# Patient Record
Sex: Female | Born: 2000 | Race: Black or African American | Hispanic: No | Marital: Single | Smoking: Never smoker
Health system: Southern US, Community
[De-identification: ages and names within clinical notes are randomized; demographics above are authoritative.]

## PROBLEM LIST (undated history)

## (undated) DIAGNOSIS — J45909 Unspecified asthma, uncomplicated: Secondary | ICD-10-CM

## (undated) DIAGNOSIS — D649 Anemia, unspecified: Secondary | ICD-10-CM

## (undated) HISTORY — DX: Anemia, unspecified: D64.9

## (undated) HISTORY — DX: Unspecified asthma, uncomplicated: J45.909

---
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
## 2020-01-02 ENCOUNTER — Telehealth: Payer: Self-pay | Admitting: Certified Nurse Midwife

## 2020-01-02 DIAGNOSIS — Z30017 Encounter for initial prescription of implantable subdermal contraceptive: Secondary | ICD-10-CM

## 2020-01-02 NOTE — Telephone Encounter (Signed)
Per review of 11/02/19 OV notes, nexplanon insertion discussed with Leota Sauers, CNM. Schedule days 1-5 of menses.   Order previously placed for precert, see account notes.   Routing to business office to review benefits prior to scheduling.

## 2020-01-02 NOTE — Telephone Encounter (Signed)
Patient left voicemail during lunch to schedule nexplanon insertion.

## 2020-01-03 NOTE — Telephone Encounter (Signed)
Patient called to give bcbs information to schedule nexplanon procedure.

## 2020-01-04 NOTE — Telephone Encounter (Signed)
Call placed to convey benefits for Nexplanon. Spoke with patient and conveyed the benefits. Patient understands/agreeable with the benefits. Patient does not want to wait until her cycle starts. Patient is requesting to get this done asap.

## 2020-01-05 NOTE — Telephone Encounter (Signed)
Spoke back with pt. Pt aware of needing to be on cycle for insertion of Nexplanon. Pt changed appt from 01/09/2020 to 01/23/2020 at 2pm with LMP to start on 01/20/2020. Pt agreeable and verbalized understanding.   Will route to Debbi, CNM for review and will close addendum.

## 2020-01-05 NOTE — Telephone Encounter (Signed)
Due to no contraception she needs to be on her menses for insertion.

## 2020-01-05 NOTE — Telephone Encounter (Signed)
Spoke to pt. Pt states having LMP the week 12/23/2019. Pt states is SA and has been on OCPs. Stopped taking OCPs after last LMP because she was out of town and didn't have them with her.  Pt would like to get nexplanon as soon as she can. Pt scheduled for OV for Nexplanon insertion on 01/09/2020 at 130pm. Pt agreeable. Pt aware of possible UPT at office prior to insertion and pt advised to remain abstinent until appt. Pt verbalized understanding. Pt already spoken to [facility] for precert. Orders placed.   Will route to Debbi, CNM for review and will close encounter.  Cc: Rosa for review. Pt aware about PR:$0.

## 2020-01-05 NOTE — Telephone Encounter (Signed)
Attempted to call pt. Unable to leave voice mail due to not set up. Will call back.

## 2020-01-09 ENCOUNTER — Ambulatory Visit: Payer: Self-pay | Admitting: Certified Nurse Midwife

## 2020-01-19 ENCOUNTER — Other Ambulatory Visit: Payer: Self-pay

## 2020-01-23 ENCOUNTER — Other Ambulatory Visit: Payer: Self-pay

## 2020-01-23 ENCOUNTER — Ambulatory Visit (INDEPENDENT_AMBULATORY_CARE_PROVIDER_SITE_OTHER): Payer: BC Managed Care – PPO | Admitting: Certified Nurse Midwife

## 2020-01-23 VITALS — BP 110/70 | Temp 97.7°F | Wt 201.0 lb

## 2020-01-23 DIAGNOSIS — Z309 Encounter for contraceptive management, unspecified: Secondary | ICD-10-CM | POA: Insufficient documentation

## 2020-01-23 DIAGNOSIS — N912 Amenorrhea, unspecified: Secondary | ICD-10-CM | POA: Diagnosis not present

## 2020-01-23 NOTE — Progress Notes (Signed)
Patient here for nexplanon today but unable to do. Patient has not had her menstrual cycle yet & is not on any birth control. Patient is aware to wait until she starts her menstrual & call us so we can schedule her to have it put in. She is aware we will also do upt that day. Patient request a upt to be done today also since she is late on her cycle.  UPT-neg

## 2020-01-24 LAB — POCT URINE PREGNANCY: Preg Test, Ur: NEGATIVE

## 2020-02-08 ENCOUNTER — Telehealth: Payer: Self-pay | Admitting: Certified Nurse Midwife

## 2020-02-08 DIAGNOSIS — Z3009 Encounter for other general counseling and advice on contraception: Secondary | ICD-10-CM

## 2020-02-08 DIAGNOSIS — Z30017 Encounter for initial prescription of implantable subdermal contraceptive: Secondary | ICD-10-CM

## 2020-02-08 NOTE — Telephone Encounter (Signed)
Spoke with patient, ready to proceed with nexplanon insertion. LMP 02/07/20. Nexplanon insertion scheduled for 02/10/20 at 11am with Leota Sauers, CNM. Advised patient to take Motrin 800 mg with food and water one hour before procedure. Patient verbalzies understanding and is agreeable.   Nexplanon discussed during AEX 11/02/19, patient to return for insertion while on menses.   New order placed for precert.   Routing to provider for final review. Patient is agreeable to disposition. Will close encounter.  Cc: Soundra Pilon, [address] [place]

## 2020-02-08 NOTE — Telephone Encounter (Signed)
Patient started her cycle yesterday and calling to schedule nexplanon procedure.

## 2020-02-09 ENCOUNTER — Other Ambulatory Visit: Payer: Self-pay

## 2020-02-10 ENCOUNTER — Other Ambulatory Visit: Payer: Self-pay

## 2020-02-10 ENCOUNTER — Encounter: Payer: Self-pay | Admitting: Certified Nurse Midwife

## 2020-02-10 ENCOUNTER — Ambulatory Visit (INDEPENDENT_AMBULATORY_CARE_PROVIDER_SITE_OTHER): Payer: BC Managed Care – PPO | Admitting: Certified Nurse Midwife

## 2020-02-10 VITALS — BP 120/76 | HR 70 | Temp 98.1°F | Resp 16 | Wt 204.0 lb

## 2020-02-10 DIAGNOSIS — Z01812 Encounter for preprocedural laboratory examination: Secondary | ICD-10-CM | POA: Diagnosis not present

## 2020-02-10 DIAGNOSIS — Z30017 Encounter for initial prescription of implantable subdermal contraceptive: Secondary | ICD-10-CM | POA: Diagnosis not present

## 2020-02-10 DIAGNOSIS — Z3009 Encounter for other general counseling and advice on contraception: Secondary | ICD-10-CM

## 2020-02-10 LAB — POCT URINE PREGNANCY: Preg Test, Ur: NEGATIVE

## 2020-02-10 NOTE — Progress Notes (Signed)
Review of Systems  Constitutional: Negative.   HENT: Negative.   Eyes: Negative.   Respiratory: Negative.   Cardiovascular: Negative.   Gastrointestinal: Negative.   Genitourinary: Negative.   Musculoskeletal: Negative.   Skin: Negative.   Neurological: Negative.   Endo/Heme/Allergies: Negative.   Psychiatric/Behavioral: The patient is nervous/anxious.        No issues other than the procedure    18 yrs African American Single female G0P0000  presents for Nexplanon insertion. LMP February 07, 2020.    Contraception: condoms   UPT negative Questions addressed regarding insertionl and expectations..  Consent signed. Requests same as above.  HPI neg.  Exam: Healthy WDWN female Orientation x 3,  Affect normal Procedure: Patient placed supine on exam table with herleft arm  flexed at the elbow and externally rotated.The insertion site was identified on the inner side of upper arm at the medial Epicondyle of the Humerus. Target site for insertion was marked at 8 cm to 12 cm. The insertion site was cleansed with betadine solution x 3  and 3 ml of 1% Lidocaine Lot # [id number] exp. April 2022 was injected along the insertion site. The inserter covering was removed and the Nexplanon device was visualized in the inserted needle. Skin was pierced and Nexplanon Lot# [id number] was  inserted along tract created by Lidocaine. Inserted was removed and  Nexplanon was immediately palpated in the correct site under the skin intact. Patient also palpated the same.  The area of insertion was closed with a steri strips.  No active bleeding was noted. A small adhesive bandage placed over the insertion site.  A pressure bandage was placed over the site. Instructions for care of insertion site were given verbally and written. ID card with insertion and removal date given to patient to carry in wallet. Patient tolerated procedure well. Patient escorted to check out in stable condition. Recheck in 2-3 weeks or if  issues.  Assessment:  Nexplanon Insertion Pt tolerated procedure well. Planned contraception:The current method of family planning is condoms and now Nexplanon. Plan :Instructions and warning signs and symptoms given.  Remove bandage in 3 days. Questions addressed    Return Visit 2-3 weeks, prn

## 2020-02-10 NOTE — Patient Instructions (Signed)
Nexplanon Instructions After Insertion  Keep bandage clean and dry for 24 hours  May use ice/Tylenol/Ibuprofen for soreness or pain  If you develop fever, drainage or increased warmth from incision site-contact office immediately   

## 2020-02-20 ENCOUNTER — Ambulatory Visit: Payer: BC Managed Care – PPO | Admitting: Certified Nurse Midwife

## 2020-02-20 ENCOUNTER — Telehealth: Payer: Self-pay | Admitting: Certified Nurse Midwife

## 2020-02-20 ENCOUNTER — Other Ambulatory Visit: Payer: Self-pay

## 2020-02-20 NOTE — Telephone Encounter (Signed)
Call to patient, no answer, voicemail not set up.

## 2020-02-20 NOTE — Telephone Encounter (Signed)
Patient arrived for appointment and cancelled due to cost. Offered promissory note for end of week, patient declined. States she would rather wait until appointment on 02/29/20.

## 2020-02-20 NOTE — Telephone Encounter (Signed)
Patient returned a call to Jill.   

## 2020-02-20 NOTE — Telephone Encounter (Signed)
Spoke with patient. Patient reports urinary urgency for the past 2 wks. States sometimes she barley makes it to the restroom. Voids normal to small amounts. Experienced dysuria initially, states this resolved with OTC medication. Denies flank pain, fever/chills. Reports she does not consume caffeine on a daily basis.   [id number] prescreen negative, precautions reviewed. OV scheduled for today at 2pm with Leota Sauers, CNM.   Routing to provider for final review. Patient is agreeable to disposition. Will close encounter.

## 2020-02-20 NOTE — Telephone Encounter (Signed)
Patient is having urinary urgency.

## 2020-02-29 ENCOUNTER — Ambulatory Visit: Payer: BC Managed Care – PPO | Admitting: Certified Nurse Midwife

## 2020-02-29 ENCOUNTER — Other Ambulatory Visit: Payer: Self-pay

## 2020-02-29 NOTE — Progress Notes (Deleted)
  19 y.o. Single {Race/ethnicity:[id number]} female G0P0000 here with complaint of UTI, with onset  on ***. Patient complaining of urinary frequency/urgency/ and pain with urination. Patient denies fever, chills, nausea or back pain. No new personal products. Patient feels *** to sexual activity. Denies any vaginal symptoms.    Contraception is ***. Menopausal with vaginal dryness. Patient *** adequate water intake.  ROS  O: Healthy female WDWN Affect: Normal, orientation x 3 Skin : warm and dry CVAT: *** bilateral Abdomen: *** for suprapubic tenderness  Pelvic exam: External genital area: normal, no lesions Bladder,Urethra tender, Urethral meatus: tender, red Vagina: normal vaginal discharge, normal appearance  Wet prep *** Cervix: normal, non tender Uterus:normal,non tender Adnexa: normal non tender, no fullness or masses   A: UTI Normal pelvic exam  P: Reviewed findings of UTI and need for treatment. Rx: DAP:TCKFW micro, culture Reviewed warning signs and symptoms of UTI and need to advise if occurring. Encouraged to limit soda, tea, and coffee and be sure to increase water intake.   RV prn

## 2020-03-07 ENCOUNTER — Telehealth: Payer: Self-pay | Admitting: Certified Nurse Midwife

## 2020-03-07 ENCOUNTER — Ambulatory Visit: Payer: BC Managed Care – PPO | Admitting: Certified Nurse Midwife

## 2020-03-07 ENCOUNTER — Encounter: Payer: Self-pay | Admitting: Certified Nurse Midwife

## 2020-03-07 NOTE — Telephone Encounter (Signed)
Patient dnka her 2.5 week follow up appointment today. I was unable to reach patient by phone, letter mailed.

## 2020-03-21 ENCOUNTER — Encounter: Payer: Self-pay | Admitting: Certified Nurse Midwife

## 2020-04-26 ENCOUNTER — Ambulatory Visit: Payer: Self-pay

## 2020-07-17 ENCOUNTER — Encounter (HOSPITAL_COMMUNITY): Payer: Self-pay

## 2020-07-17 ENCOUNTER — Other Ambulatory Visit: Payer: Self-pay

## 2020-07-17 ENCOUNTER — Emergency Department (HOSPITAL_COMMUNITY)
Admission: EM | Admit: 2020-07-17 | Discharge: 2020-07-18 | Disposition: A | Discharge status: Home / Self Care | Payer: Managed Care, Other (non HMO) | Attending: Emergency Medicine | Admitting: Emergency Medicine

## 2020-07-17 DIAGNOSIS — Y999 Unspecified external cause status: Secondary | ICD-10-CM | POA: Diagnosis not present

## 2020-07-17 DIAGNOSIS — Y929 Unspecified place or not applicable: Secondary | ICD-10-CM | POA: Insufficient documentation

## 2020-07-17 DIAGNOSIS — Y939 Activity, unspecified: Secondary | ICD-10-CM | POA: Diagnosis not present

## 2020-07-17 DIAGNOSIS — Z5321 Procedure and treatment not carried out due to patient leaving prior to being seen by health care provider: Secondary | ICD-10-CM | POA: Diagnosis not present

## 2020-07-17 DIAGNOSIS — S61411A Laceration without foreign body of right hand, initial encounter: Secondary | ICD-10-CM | POA: Insufficient documentation

## 2020-07-17 DIAGNOSIS — W25XXXA Contact with sharp glass, initial encounter: Secondary | ICD-10-CM | POA: Insufficient documentation

## 2020-07-17 NOTE — ED Triage Notes (Signed)
Pt reports she was washing dishes and accidentally cut herself with a glass. Pt has less than inch long lac to posterior right hand. Bleeding controlled at this time.

## 2020-07-18 ENCOUNTER — Encounter (HOSPITAL_COMMUNITY): Payer: Self-pay

## 2020-07-18 ENCOUNTER — Ambulatory Visit (INDEPENDENT_AMBULATORY_CARE_PROVIDER_SITE_OTHER)
Admission: EM | Admit: 2020-07-18 | Discharge: 2020-07-18 | Disposition: A | Discharge status: Home / Self Care | Payer: Managed Care, Other (non HMO) | Source: Home / Self Care | Attending: Family Medicine | Admitting: Family Medicine

## 2020-07-18 ENCOUNTER — Emergency Department (HOSPITAL_COMMUNITY): Payer: Managed Care, Other (non HMO)

## 2020-07-18 DIAGNOSIS — S61411A Laceration without foreign body of right hand, initial encounter: Secondary | ICD-10-CM

## 2020-07-18 NOTE — ED Provider Notes (Signed)
[facility]   [id number] 07/18/20 Arrival Time: 6195  ASSESSMENT & PLAN:  1. Laceration of right hand without foreign body, initial encounter      I have personally viewed the imaging studies ordered in ED. No radio-opaque FB observed.  Procedure: Verbal consent obtained. Patient provided with risks and alternatives to the procedure. Wound copiously irrigated with NS then cleansed with betadine. Local anesthesia: Lidocaine 1% without epinephrine. Wound carefully explored. No foreign body, tendon injury, or nonviable tissue were noted. Using sterile technique, 4 interrupted 4-0 Prolene sutures were placed to reapproximate the wound. Procedure tolerated well. No complications. Minimal bleeding. Advised to look for and return for any signs of infection such as redness, swelling, discharge, or worsening pain. Return for suture removal in 5-7 days.    Discharge Instructions     If not allergic, you may use over the counter ibuprofen or acetaminophen as needed.    Reviewed expectations re: course of current medical issues. Questions answered. Outlined signs and symptoms indicating need for more acute intervention. Patient verbalized understanding. After Visit Summary given.   SUBJECTIVE:  Holly Mccoy is a 19 y.o. female who presents with a laceration of her R dorsal hand; approx 11 hours ago; broken kitchen glass; minimal bleeding. No extremity sensation changes or weakness.   Td UTD: Yes.  ROS: As per HPI. Health Maintenance Due  Topic Date Due  . Hepatitis C Screening  Never done  . COVID-19 Vaccine (1) Never done  . CHLAMYDIA SCREENING  Never done  . HIV Screening  Never done  . TETANUS/TDAP  Never done    OBJECTIVE:  Vitals:   07/18/20 1022 07/18/20 1023  BP: 129/71   Pulse: 90   Resp: 16   Temp: 98.2 F (36.8 C)   TempSrc: Oral   SpO2: 98%   Weight:  86.2 kg  Height:  5\' 5"  (1.651 m)     General appearance: alert; no distress Skin: linear  but slightly curved laceration of dorsal right hand; size: approx 1.5 cm; clean wound edges, no foreign bodies; without active bleeding; all finger with FROM; normal cap refill Psychological: alert and cooperative; normal mood and affect  Results for orders placed or performed in visit on 02/10/20  POCT urine pregnancy  Result Value Ref Range   Preg Test, Ur Negative Negative     DG Hand Complete Right  Result Date: 07/18/2020 CLINICAL DATA:  Laceration EXAM: RIGHT HAND - COMPLETE 3+ VIEW COMPARISON:  None. FINDINGS: There is no evidence of fracture or dislocation. There is no evidence of arthropathy or other focal bone abnormality. Mild soft tissue swelling seen on the dorsum of the hand with a small overlying laceration. No radiopaque foreign body. IMPRESSION: No acute osseous abnormality or radiopaque foreign body. Electronically Signed   By: 07/20/2020 M.D.   On: 07/18/2020 00:59    Allergies  Allergen Reactions  . Citric Acid Itching    Past Medical History:  Diagnosis Date  . Anemia   . Asthma    Social History   Socioeconomic History  . Marital status: Single    Spouse name: Not on file  . Number of children: Not on file  . Years of education: Not on file  . Highest education level: Not on file  Occupational History  . Not on file  Tobacco Use  . Smoking status: Never Smoker  . Smokeless tobacco: Never Used  Substance and Sexual Activity  . Alcohol use: Not Currently  . Drug  use: Never  . Sexual activity: Yes    Partners: Male    Birth control/protection: Condom  Other Topics Concern  . Not on file  Social History Narrative  . Not on file   Social Determinants of Health   Financial Resource Strain:   . Difficulty of Paying Living Expenses:   Food Insecurity:   . Worried About Programme researcher, broadcasting/film/video in the Last Year:   . Barista in the Last Year:   Transportation Needs:   . Freight forwarder (Medical):   Marland Kitchen Lack of Transportation  (Non-Medical):   Physical Activity:   . Days of Exercise per Week:   . Minutes of Exercise per Session:   Stress:   . Feeling of Stress :   Social Connections:   . Frequency of Communication with Friends and Family:   . Frequency of Social Gatherings with Friends and Family:   . Attends Religious Services:   . Active Member of Clubs or Organizations:   . Attends Banker Meetings:   Marland Kitchen Marital Status:          Mardella Layman, MD 07/18/20 1053

## 2020-07-18 NOTE — ED Notes (Signed)
Pt called from triage with no answer 

## 2020-07-18 NOTE — ED Notes (Signed)
Eloped from waiting area.  °

## 2020-07-18 NOTE — Discharge Instructions (Signed)
If not allergic, you may use over the counter ibuprofen or acetaminophen as needed. ° °

## 2020-07-18 NOTE — ED Triage Notes (Signed)
Pt states a glass broke while she was washing it and received a laceration on posterior of right hand. Pt has bandage on hand. Bleeding is controlled.

## 2020-12-04 ENCOUNTER — Encounter (HOSPITAL_COMMUNITY): Payer: Self-pay | Admitting: Behavioral Health

## 2020-12-04 ENCOUNTER — Other Ambulatory Visit: Payer: Self-pay

## 2020-12-04 ENCOUNTER — Encounter (HOSPITAL_COMMUNITY): Payer: Self-pay | Admitting: Physician Assistant

## 2020-12-04 ENCOUNTER — Ambulatory Visit (INDEPENDENT_AMBULATORY_CARE_PROVIDER_SITE_OTHER): Payer: No Payment, Other | Admitting: Behavioral Health

## 2020-12-04 ENCOUNTER — Ambulatory Visit (HOSPITAL_COMMUNITY)
Admission: EM | Admit: 2020-12-04 | Discharge: 2020-12-04 | Disposition: A | Discharge status: Home / Self Care | Payer: No Payment, Other

## 2020-12-04 ENCOUNTER — Telehealth (INDEPENDENT_AMBULATORY_CARE_PROVIDER_SITE_OTHER): Payer: No Payment, Other | Admitting: Physician Assistant

## 2020-12-04 DIAGNOSIS — F331 Major depressive disorder, recurrent, moderate: Secondary | ICD-10-CM

## 2020-12-04 DIAGNOSIS — F332 Major depressive disorder, recurrent severe without psychotic features: Secondary | ICD-10-CM

## 2020-12-04 DIAGNOSIS — F411 Generalized anxiety disorder: Secondary | ICD-10-CM | POA: Diagnosis not present

## 2020-12-04 MED ORDER — FLUOXETINE HCL 20 MG PO CAPS: 20.0000 mg | ORAL_CAPSULE | Freq: Every day | ORAL | 1 refills | Status: DC

## 2020-12-04 MED ORDER — HYDROXYZINE HCL 25 MG PO TABS: 25.0000 mg | ORAL_TABLET | Freq: Three times a day (TID) | ORAL | 0 refills | Status: DC | PRN

## 2020-12-04 NOTE — ED Triage Notes (Signed)
Walk in to [facility] to have a psych evaluation and talk with someone about medication management if possible. Denies SI, and HI at this time, Denies AVH. Pain rating 0/10.  No psychiatrist or therapist.

## 2020-12-04 NOTE — Progress Notes (Signed)
Psychiatric Initial Adult Assessment   Virtual Visit via Telephone Note  I connected with Holly Mccoy on 12/04/2020 at  3:00 PM EST by telephone and verified that I am speaking with the correct person using two identifiers.  Location: Patient: Home Provider: Clinic   I discussed the limitations, risks, security and privacy concerns of performing an evaluation and management service by telephone and the availability of in person appointments. I also discussed with the patient that there may be a patient responsible charge related to this service. The patient expressed understanding and agreed to proceed.  Follow Up Instructions:   I discussed the assessment and treatment plan with the patient. The patient was provided an opportunity to ask questions and all were answered. The patient agreed with the plan and demonstrated an understanding of the instructions.   The patient was advised to call back or seek an in-person evaluation if the symptoms worsen or if the condition fails to improve as anticipated.  I provided 40 minutes of non-face-to-face time during this encounter.  Meta Hatchet, PA   Patient Identification: Holly Mccoy MRN:  [id number] Date of Evaluation:  12/04/2020 Referral Source: [facility] Chief Complaint:   "To start therapy and be placed on medication for depression." Visit Diagnosis: No diagnosis found.  History of Present Illness:   Holly Mccoy is a 19 year old female with no significant past psychiatric history who presents to [facility] via virtual telephone visit "To start therapy and be placed on medication for depression."  Patient states that she was referred to [facility] after being triaged at [facility] [place].  Patient's main concern today is medication management for her depression, social anxiety, and grief.  Patient reports that her depression stems from her grief and  that she has never been officially diagnosed with or addressed her depression until today.  Patient reports that she was 9 when her cousin passed away.  Patient states that she also experienced the death of her grandmother in 2016/04/17 and the death of her grandfather in April 18, 2019.  Patient reports that she never received medications or learned coping skills to help deal with the passing of a loved one.  Patient endorses the following symptoms: sadness, difficulty getting out of bed, self isolation, and crying spells. Patient also endorses anxiety that is often triggered by interacting with people, people looking at her, large crowds, and being in unfamiliar places.  Patient rates her anxiety a 5 out of 10 and states that she is able to function but it makes her very uncomfortable. Patient is currently enrolled in [employer] and has had a difficult time trying to maintain focus and get her work done due to her depressive symptoms. Patient is requesting a brief leave absence so that she can recover mentally and be more prepared for the next semester of school.  Patient denies current suicidal ideations.  Patient admits to having a past history of self injurious behavior via cutting arms and thighs.  Patient reports that the last time she engaged in cutting was in 17-Apr-2017.  Patient denies homicidal ideations.  Patient endorses fair sleep and receives on average 5 to 6 hours of intermittent sleep.  Patient endorses increased appetite and eats on average 1-2 meals per day.  Patient denies auditory and visual hallucinations.  Patient endorses occasional alcohol consumption and drinks roughly 2 times per month.  Patient endorses tobacco use and states that she often mixes the tobacco filler with marijuana and  smokes it.  Patient endorses illicit drug use in the form of marijuana and smokes everyday.  Associated Signs/Symptoms: Depression Symptoms:  depressed mood, anhedonia, hypersomnia, psychomotor  agitation, psychomotor retardation, feelings of worthlessness/guilt, difficulty concentrating, hopelessness, impaired memory, anxiety, panic attacks, loss of energy/fatigue, decreased labido, decreased appetite, (Hypo) Manic Symptoms:  Distractibility, Elevated Mood, Flight of Ideas, Impulsivity, Irritable Mood, Labiality of Mood, Sexually Inapproprite Behavior, Anxiety Symptoms:  Excessive Worry, Panic Symptoms, Social Anxiety, Specific Phobias, Psychotic Symptoms:  N/A PTSD Symptoms: Had a traumatic exposure:  emotional and mental trauma related to grief Re-experiencing:  Flashbacks Patient states that she flashbacks to happy memories or memories where she felt hurt by something Avoidance:  Decreased Interest/Participation  Past Psychiatric History: None  Previous Psychotropic Medications: No   Substance Abuse History in the last 12 months:  Yes.    Consequences of Substance Abuse: NA  Past Medical History:  Past Medical History:  Diagnosis Date  . Anemia   . Asthma    No past surgical history on file.  Family Psychiatric History: Grandmother (father's side) - Bipolar and schizophrenia  Family History:  Family History  Problem Relation Age of Onset  . Hypertension Mother   . Cervical cancer Maternal Grandmother   . Colon cancer Maternal Grandfather   . Heart attack Maternal Grandfather     Social History:   Social History   Socioeconomic History  . Marital status: Single    Spouse name: Not on file  . Number of children: Not on file  . Years of education: Not on file  . Highest education level: Not on file  Occupational History  . Not on file  Tobacco Use  . Smoking status: Never Smoker  . Smokeless tobacco: Never Used  Vaping Use  . Vaping Use: Never used  Substance and Sexual Activity  . Alcohol use: Not Currently  . Drug use: Yes    Types: Marijuana  . Sexual activity: Yes    Partners: Male    Birth control/protection: Condom   Other Topics Concern  . Not on file  Social History Narrative  . Not on file   Social Determinants of Health   Financial Resource Strain:   . Difficulty of Paying Living Expenses: Not on file  Food Insecurity:   . Worried About Programme researcher, broadcasting/film/video in the Last Year: Not on file  . Ran Out of Food in the Last Year: Not on file  Transportation Needs:   . Lack of Transportation (Medical): Not on file  . Lack of Transportation (Non-Medical): Not on file  Physical Activity:   . Days of Exercise per Week: Not on file  . Minutes of Exercise per Session: Not on file  Stress:   . Feeling of Stress : Not on file  Social Connections:   . Frequency of Communication with Friends and Family: Not on file  . Frequency of Social Gatherings with Friends and Family: Not on file  . Attends Religious Services: Not on file  . Active Member of Clubs or Organizations: Not on file  . Attends Banker Meetings: Not on file  . Marital Status: Not on file    Additional Social History: Patient is currently attending [employer] Patient was working at [employer] but had to quit in order to pursue school  Allergies:   Allergies  Allergen Reactions  . Citric Acid Itching    Metabolic Disorder Labs: No results found for: HGBA1C, MPG No results found for: PROLACTIN No results  found for: CHOL, TRIG, HDL, CHOLHDL, VLDL, LDLCALC No results found for: TSH  Therapeutic Level Labs: No results found for: LITHIUM No results found for: CBMZ No results found for: VALPROATE  Current Medications: Current Outpatient Medications  Medication Sig Dispense Refill  . albuterol (VENTOLIN HFA) 108 (90 Base) MCG/ACT inhaler Inhale into the lungs.    Marland Kitchen etonogestrel (NEXPLANON) 68 MG IMPL implant 1 each by Subdermal route once.     No current facility-administered medications for this visit.    Musculoskeletal: Strength & Muscle Tone: within normal limits Gait & Station: normal Patient leans:  N/A  Psychiatric Specialty Exam: Review of Systems  Psychiatric/Behavioral: Positive for decreased concentration. Negative for hallucinations, self-injury (Patient has a past history of self injurious behavior), sleep disturbance and suicidal ideas. The patient is nervous/anxious.     There were no vitals taken for this visit.There is no height or weight on file to calculate BMI.  General Appearance: Fairly Groomed  Eye Contact:  Good  Speech:  Clear and Coherent and Normal Rate  Volume:  Normal  Mood:  Euthymic  Affect:  Appropriate and Congruent  Thought Process:  Coherent, Goal Directed and Descriptions of Associations: Intact  Orientation:  Full (Time, Place, and Person)  Thought Content:  WDL and Logical  Suicidal Thoughts:  No  Homicidal Thoughts:  No  Memory:  Immediate;   Good Recent;   Good Remote;   Good  Judgement:  Fair  Insight:  Fair  Psychomotor Activity:  Normal  Concentration:  Concentration: Good and Attention Span: Good  Recall:  Good  Fund of Knowledge:Good  Language: Good  Akathisia:  NA  Handed:  Right  AIMS (if indicated):  not done  Assets:  Communication Skills Desire for Improvement Housing Vocational/Educational  ADL's:  Intact  Cognition: WNL  Sleep:  Fair   Screenings:   Assessment and Plan:   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  Patient reports that her depression stems from the loss of life she has experienced in her past.  Patient also experiences anxiety that is triggered by interacting with people, people looking at her, large crowds, and being in unfamiliar places.  Patient was recommended fluoxetine 20 mg for the  management of her depressive symptoms and hydroxyzine 25 mg 3 times daily as needed for the management of her anxiety.  Patient was agreeable to plan.  Patient will be provided a request for a brief leave of absence from her current courses due to exacerbation of mental health.  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  Dispense: 30 capsule; Refill: 1  2. Generalized anxiety disorder  - FLUoxetine (PROZAC) 20 MG capsule; Take 1 capsule (20 mg total) by mouth daily.  Dispense: 30 capsule; Refill: 1 - hydrOXYzine (ATARAX/VISTARIL) 25 MG tablet; Take 1 tablet (25 mg total) by mouth 3 (three) times daily as needed.  Dispense: 90 tablet; Refill: 0  Patient to follow up in 4 weeks  Meta Hatchet, PA 12/7/20213:18 PM

## 2020-12-04 NOTE — Progress Notes (Signed)
   THERAPIST PROGRESS NOTE  Session Time: 1:00PM  Participation Level: Active  Behavioral Response: Well GroomedAlertDepressed  Type of Therapy: Individual Therapy  Treatment Goals addressed: Diagnosis: MDD  Interventions: CBT  Summary: Holly Mccoy is a 19 y.o. female who presents to [facility] as a walk-in. [name] initially presented downstairs to the [facility] and was seen by Dr. Dwyane Dee. At this time, pt was recommended to follow-up with outpatient services upstairs. Pt came upstairs to be seen by an outpatient provider for therapy and medication management. [name] presents alert and oriented x4 with a flat affect. She shared with this Probation officer that she has been dealing with several social stressors. She identified her current stressors to be: currently unemployed, attending aesthetician school full time, living away from home and primary support system, her grandmother died in 02/23/2016, and her grandfather died in 10/23/2019. She mentioned that this past 2023/10/23 was the anniversary of his death and they were really close prior to his death. She reports feeling overwhelmed by her school work since it's a 4 month accelerated program. She is concerned about falling behind in her coursework if she continues to miss class. She reports on some days "I just don't feel like doing anything. And some days I just cry and I don't know why I am crying". She further reports decreased sleep patterns, low energy, feelings of anhedonia, feeling "blah", and hopelessness. She reports hx of cutting, with the last episode being in February 22, 2018. She informed this Probation officer that she has not had an urge to self-harm.   Suicidal/Homicidal: No  Therapist Response: [name] met criteria for Major Depressive Disorder. Therapist offered encouragement and support. Therapist encouraged clt to utilize journaling as a coping skill. She was also recommended to utilize positive self-affirmations to manage her negative thoughts. Therapist discussed  the stages of grief and allowing herself to "feel those unwanted emotions" around the thoughts of her grandfather, grandmother, and cousin. Therapist also encouraged pt to talk to her support system when feeling down and needing someone to listen.  Plan: Return again in 1 week.  Diagnosis: Axis I: Major Depression, Recurrent severe    Axis II: No diagnosis    Allyne Gee, Counselor 12/04/2020

## 2020-12-04 NOTE — Discharge Instructions (Signed)
To follow up in open access upstairs

## 2020-12-04 NOTE — Progress Notes (Signed)
Holly Mccoy was triaged by the staff and rerouted up stairs. The front desk was notified and asked her to stop back down stairs to pick up her AVS.

## 2020-12-04 NOTE — ED Provider Notes (Signed)
Behavioral Health Urgent Care Medical Screening Exam  Patient Name: Holly Mccoy MRN: [id number] Date of Evaluation: 12/04/20 Chief Complaint:   Diagnosis:  Final diagnoses:  None    History of Present illness: Holly Mccoy is a 19 y.o. female who presents to the be held for wanting outpatient services.  Patient states that she wants to see a therapist and a psychiatrist for medication management and she has been feeling depressed for many years now, it is worsened over the last month or so.  Patient states that she has problems with self-esteem, feels overwhelmed at times, has had a lot of losses.  On being asked elaborate, she reports that she lost her grandmother in 2017, her grandfather last year and has not done well with her grades.  She reports that her parents are supportive, lived in [place] but she is doing her education here in [place] and this is her second year.  Patient reports feeling sad at times, as that she has 2 days out of the week where she feels sad, tearful, struggles with her self-esteem, but denies having thoughts of wanting to end her life, any suicidal ideation, homicidal ideation, any psychotic symptoms, any symptoms of mania.  She does report that she took an overdose in 2018 on Benadryl but did not seek any treatment at that time.  She has that she is cut herself in the past.  She reports her last attempt was to overdose in 2018.  Patient reports that she talks to her cousin when she feels overwhelmed, reports that her roommate is supportive and adds that she wants "help if she felt that she was suicidal, wanted to end her life.  Psychiatric Specialty Exam  Presentation  General Appearance:No data recorded Eye Contact:Fair  Speech:Clear and Coherent;Normal Rate  Speech Volume:Decreased  Handedness:No data recorded  Mood and Affect  Mood:Depressed;Worthless  Affect:Appropriate;Constricted;Tearful   Thought Process  Thought  Processes:Coherent  Descriptions of Associations:Intact  Orientation:Full (Time, Place and Person)  Thought Content:Rumination;WDL  Hallucinations:None  Ideas of Reference:Other (comment)  Suicidal Thoughts:No  Homicidal Thoughts:No   Sensorium  Memory:Immediate Fair;Remote Fair;Recent Fair  Judgment:Fair  Insight:Fair   Executive Functions  Concentration:Fair  Attention Span:Fair  Recall:Fair  Fund of Knowledge:Fair  Language:Fair   Psychomotor Activity  Psychomotor Activity:Normal   Assets  Assets:Communication Skills;Desire for Improvement;Talents/Skills;Housing;Physical Health;Social Support   Sleep  Sleep:Fair  Number of hours: No data recorded  Physical Exam: Physical Exam Constitutional:      Appearance: Normal appearance.  HENT:     Head: Normocephalic and atraumatic.     Nose: Nose normal.     Mouth/Throat:     Mouth: Mucous membranes are moist.  Eyes:     Extraocular Movements: Extraocular movements intact.     Conjunctiva/sclera: Conjunctivae normal.     Pupils: Pupils are equal, round, and reactive to light.  Cardiovascular:     Rate and Rhythm: Normal rate and regular rhythm.  Pulmonary:     Effort: Pulmonary effort is normal.     Breath sounds: Normal breath sounds.  Abdominal:     General: Bowel sounds are normal.  Musculoskeletal:        General: Normal range of motion.     Cervical back: Normal range of motion and neck supple.  Skin:    General: Skin is warm.  Neurological:     General: No focal deficit present.     Mental Status: She is alert.    ROS Blood pressure 134/77, pulse 61, temperature (!)  97.5 F (36.4 C), temperature source Tympanic, resp. rate 18, height 5\' 5"  (1.651 m), weight 86.2 kg, SpO2 100 %. Body mass index is 31.62 kg/m.  Musculoskeletal: Strength & Muscle Tone: within normal limits Gait & Station: normal Patient leans: N/A   [facility] MSE Discharge Disposition for Follow up and  Recommendations: Based on my evaluation the patient does not appear to have an emergency medical condition and can be discharged with resources and follow up care in outpatient services for referred to outpatient on 2nd floor at the [facility] outpatient   [facility], MD 12/04/2020, 1:01 PM

## 2020-12-13 ENCOUNTER — Other Ambulatory Visit: Payer: Self-pay

## 2020-12-13 ENCOUNTER — Ambulatory Visit (INDEPENDENT_AMBULATORY_CARE_PROVIDER_SITE_OTHER): Payer: No Payment, Other | Admitting: Behavioral Health

## 2020-12-13 DIAGNOSIS — F331 Major depressive disorder, recurrent, moderate: Secondary | ICD-10-CM

## 2020-12-13 NOTE — Progress Notes (Signed)
   THERAPIST PROGRESS NOTE  Session Time: 1:00PM  Participation Level: Minimal  Behavioral Response: N/A - VirtualAlertPleasant  Type of Therapy: Individual Therapy  Treatment Goals addressed: Diagnosis: MDD  Interventions: CBT  Summary: Holly Mccoy is a 19 y.o. female who met with this Probation officer for a scheduled individual therapy session. Due to technical issues, MyChart was not working properly so this Probation officer called clt on the phone number listed in chart. Clt answered call and stated she was attempting to login to the video chat. Clt checked-in by stating "I've been okay". Clt reports her doctor wrote her out of school until 12/31/20 for mental health reasons. Clt shared that she decided to travel home for the holidays because she didn't want to be alone in her apartment. Clt was unable to identify any stressors she wanted to process today. She was alert and oriented x4. Clt denied SI/HI/AVH.   Suicidal/Homicidal: No  Therapist Response: Therapist offered encouragement and support.  Plan: Return again in 2 weeks.  Diagnosis: Axis I: Major Depression, Recurrent severe    Axis II: No diagnosis    Allyne Gee, Counselor 12/13/2020

## 2020-12-26 ENCOUNTER — Telehealth (HOSPITAL_COMMUNITY): Payer: Self-pay | Admitting: General Practice

## 2020-12-26 NOTE — Telephone Encounter (Signed)
Meta Hatchet,  Care Management - Follow Up [facility] Discharges   Writer attempted to make contact with patient today and was unsuccessful.  Writer was able to leave a HIPPA compliant voice message and will await callback.  Per chart review, patient is currently receiving outpatient medication management with the NP, Nwoko, Tommas Olp, and a therapist Toy Care, LCSW at [facility].

## 2021-01-03 ENCOUNTER — Other Ambulatory Visit: Payer: Self-pay

## 2021-01-03 ENCOUNTER — Telehealth (INDEPENDENT_AMBULATORY_CARE_PROVIDER_SITE_OTHER): Payer: No Payment, Other | Admitting: Physician Assistant

## 2021-01-03 ENCOUNTER — Ambulatory Visit (HOSPITAL_COMMUNITY): Payer: No Payment, Other | Admitting: Physician Assistant

## 2021-01-03 DIAGNOSIS — F332 Major depressive disorder, recurrent severe without psychotic features: Secondary | ICD-10-CM

## 2021-01-03 DIAGNOSIS — F411 Generalized anxiety disorder: Secondary | ICD-10-CM | POA: Diagnosis not present

## 2021-01-04 NOTE — Progress Notes (Signed)
BH MD/PA/NP OP Progress Note  Virtual Visit via Telephone Note  I connected with Holly Mccoy on 01/03/2021 at  4:30 PM EST by telephone and verified that I am speaking with the correct person using two identifiers.  Location: Patient: Home Provider: Clinic   I discussed the limitations, risks, security and privacy concerns of performing an evaluation and management service by telephone and the availability of in person appointments. I also discussed with the patient that there may be a patient responsible charge related to this service. The patient expressed understanding and agreed to proceed.  Follow Up Instructions:   I discussed the assessment and treatment plan with the patient. The patient was provided an opportunity to ask questions and all were answered. The patient agreed with the plan and demonstrated an understanding of the instructions.   The patient was advised to call back or seek an in-person evaluation if the symptoms worsen or if the condition fails to improve as anticipated.  I provided 27 minutes of non-face-to-face time during this encounter.   Meta Hatchet, PA   01/03/2021 4:30 PM Holly Mccoy  MRN:  [id number]  Chief Complaint: Follow up and medication management  HPI:  Holly Mccoy is a 20 year old female with a past psychiatric history significant for major depressive disorder and generalized anxiety disorder who presents to [facility] via virtual telephone visit for follow-up and medication management.  Patient is currently being managed on the following medications:  Fluoxetine 20 mg daily Hydroxyzine 25 mg 3 times daily as needed  Patient reports no issues or concerns with her current medication regimen.  Patient reports that she has not taken hydroxyzine yet.  Patient reports that she has not experienced any complications or adverse side effects from using fluoxetine.  Patient reports an improvement  in mood but states that she still experiencing some days of sadness.  Patient has no other issues or concerns at this time.  Patient denies the need for refills at this time.  Patient reports that she recently started back up with school.  Patient denies suicidal and homicidal ideations.  She further denies auditory or visual hallucinations.  Patient endorses fair sleep and receives on average 6 to 7 hours of sleep each night.  Patient denies restful sleep and states that she may experience waking up 2-3 times per night.  Patient endorses fluctuating appetite and states that she may have 1-2 meals per day.  Patient denies alcohol consumption.  She endorses tobacco use and states that she smokes every day.  Patient endorses illicit drug use in the form of marijuana which she smokes every day.  Visit Diagnosis: No diagnosis found.  Past Psychiatric History:  Major Depressive Disorder Generalized anxiety disorder  Past Medical History:  Past Medical History:  Diagnosis Date  . Anemia   . Asthma    No past surgical history on file.  Family Psychiatric History:  Grandmother (father's side) - Bipolar and schizophrenia  Family History:  Family History  Problem Relation Age of Onset  . Hypertension Mother   . Cervical cancer Maternal Grandmother   . Colon cancer Maternal Grandfather   . Heart attack Maternal Grandfather     Social History:  Social History   Socioeconomic History  . Marital status: Single    Spouse name: Not on file  . Number of children: Not on file  . Years of education: Not on file  . Highest education level: Not on file  Occupational History  .  Not on file  Tobacco Use  . Smoking status: Never Smoker  . Smokeless tobacco: Never Used  Vaping Use  . Vaping Use: Never used  Substance and Sexual Activity  . Alcohol use: Not Currently  . Drug use: Yes    Types: Marijuana  . Sexual activity: Yes    Partners: Male    Birth control/protection: Condom  Other  Topics Concern  . Not on file  Social History Narrative  . Not on file   Social Determinants of Health   Financial Resource Strain: Not on file  Food Insecurity: Not on file  Transportation Needs: Not on file  Physical Activity: Not on file  Stress: Not on file  Social Connections: Not on file    Allergies:  Allergies  Allergen Reactions  . Citric Acid Itching    Metabolic Disorder Labs: No results found for: HGBA1C, MPG No results found for: PROLACTIN No results found for: CHOL, TRIG, HDL, CHOLHDL, VLDL, LDLCALC No results found for: TSH  Therapeutic Level Labs: No results found for: LITHIUM No results found for: VALPROATE No components found for:  CBMZ  Current Medications: Current Outpatient Medications  Medication Sig Dispense Refill  . albuterol (VENTOLIN HFA) 108 (90 Base) MCG/ACT inhaler Inhale into the lungs.    Marland Kitchen etonogestrel (NEXPLANON) 68 MG IMPL implant 1 each by Subdermal route once.    Marland Kitchen FLUoxetine (PROZAC) 20 MG capsule Take 1 capsule (20 mg total) by mouth daily. 30 capsule 1  . hydrOXYzine (ATARAX/VISTARIL) 25 MG tablet Take 1 tablet (25 mg total) by mouth 3 (three) times daily as needed. 90 tablet 0   No current facility-administered medications for this visit.      Musculoskeletal: Strength & Muscle Tone: Unable to assess due to telemedicine visit Gait & Station: Unable to assess due to telemedicine visit Patient leans: Unable to assess due to telemedicine visit  Psychiatric Specialty Exam: Review of Systems  Psychiatric/Behavioral: Negative for decreased concentration, dysphoric mood, hallucinations, self-injury, sleep disturbance and suicidal ideas. The patient is not nervous/anxious and is not hyperactive.     There were no vitals taken for this visit.There is no height or weight on file to calculate BMI.  General Appearance: Unable to assess due to telemedicine visit  Eye Contact:  Unable to assess due to telemedicine visit  Speech:   Clear and Coherent and Normal Rate  Volume:  Normal  Mood:  Euthymic  Affect:  Appropriate  Thought Process:  Coherent and Descriptions of Associations: Intact  Orientation:  Full (Time, Place, and Person)  Thought Content: WDL and Logical   Suicidal Thoughts:  No  Homicidal Thoughts:  No  Memory:  Immediate;   Good Recent;   Good Remote;   Good  Judgement:  Good  Insight:  Fair  Psychomotor Activity:  Normal  Concentration:  Concentration: Good and Attention Span: Good  Recall:  Good  Fund of Knowledge: Good  Language: Good  Akathisia:  NA  Handed:  Right  AIMS (if indicated): not done  Assets:  Communication Skills Desire for Improvement Housing Vocational/Educational  ADL's:  Intact  Cognition: WNL  Sleep:  Fair   Screenings: GAD-7   Flowsheet Row Video Visit from 12/04/2020 in [facility]  Total GAD-7 Score 11       Assessment and Plan:  Holly Mccoy is a 20 year old female with a past psychiatric history significant for major depressive disorder and generalized anxiety disorder who presents to [facility]  via virtual telephone visit for follow-up and medication management. Patient reports no issues or concerns with her current medication regimen. Patient reports that she has not experienced any complications or adverse side effects from using fluoxetine.  Patient endorses improvement in mood, however, she does experience instances of sadness.  Patient denies the need for dosage adjustments at this time and denies medication refills.  1. Severe episode of recurrent major depressive disorder, without psychotic features ([place]) Patient to continue taking Prozac 20 mg daily as prescribed  2. Generalized anxiety disorder Patient to continue taking Prozac 20 mg daily as prescribed Patient to continue taking Hydroxyzine 25 mg 3 times daily as needed for the management of her anxiety  Patient to follow  up in 6 weeks  Malachy Mood, PA 01/04/2021, 9:18 PM

## 2021-01-30 ENCOUNTER — Encounter (HOSPITAL_COMMUNITY): Payer: Self-pay | Admitting: Physician Assistant

## 2021-01-30 DIAGNOSIS — F411 Generalized anxiety disorder: Secondary | ICD-10-CM | POA: Insufficient documentation

## 2021-01-30 DIAGNOSIS — F332 Major depressive disorder, recurrent severe without psychotic features: Secondary | ICD-10-CM | POA: Insufficient documentation

## 2021-02-11 ENCOUNTER — Telehealth (HOSPITAL_COMMUNITY): Payer: Self-pay | Admitting: *Deleted

## 2021-02-11 ENCOUNTER — Other Ambulatory Visit (HOSPITAL_COMMUNITY): Payer: Self-pay | Admitting: Physician Assistant

## 2021-02-11 DIAGNOSIS — F411 Generalized anxiety disorder: Secondary | ICD-10-CM

## 2021-02-11 DIAGNOSIS — F332 Major depressive disorder, recurrent severe without psychotic features: Secondary | ICD-10-CM

## 2021-02-11 MED ORDER — FLUOXETINE HCL 20 MG PO CAPS: 20.0000 mg | ORAL_CAPSULE | Freq: Every day | ORAL | 1 refills | Status: DC

## 2021-02-11 NOTE — Progress Notes (Signed)
Provider was contacted by Holly Mccoy, RMA regarding prescription refill. Patient's medication will be e-prescribed to pharmacy of choice.

## 2021-02-11 NOTE — Telephone Encounter (Signed)
[facility] FAXED REFILL REQUEST FLUoxetine (PROZAC) 20 MG capsule 30 capsule 1 12/04/2020 12/04/2021   Sig - Route: Take 1 capsule (20 mg total) by mouth daily. - Oral

## 2021-02-11 NOTE — Telephone Encounter (Signed)
Provider was contacted by Direce E McIntyre, RMA regarding prescription refill. Patient's medication will be e-prescribed to pharmacy of choice. 

## 2021-02-15 ENCOUNTER — Other Ambulatory Visit: Payer: Self-pay

## 2021-02-15 ENCOUNTER — Telehealth (INDEPENDENT_AMBULATORY_CARE_PROVIDER_SITE_OTHER): Payer: No Payment, Other | Admitting: Physician Assistant

## 2021-02-15 ENCOUNTER — Encounter (HOSPITAL_COMMUNITY): Payer: Self-pay | Admitting: Physician Assistant

## 2021-02-15 DIAGNOSIS — F332 Major depressive disorder, recurrent severe without psychotic features: Secondary | ICD-10-CM | POA: Diagnosis not present

## 2021-02-15 DIAGNOSIS — F411 Generalized anxiety disorder: Secondary | ICD-10-CM

## 2021-02-15 MED ORDER — FLUOXETINE HCL 40 MG PO CAPS: 40.0000 mg | ORAL_CAPSULE | Freq: Every day | ORAL | 1 refills | Status: DC

## 2021-02-15 NOTE — Progress Notes (Signed)
BH MD/PA/NP OP Progress Note  Virtual Visit via Telephone Note  I connected with Ashelynn Mccoy on 02/15/21 at  2:30 PM EST by telephone and verified that I am speaking with the correct person using two identifiers.  Location: Patient: Holly Mccoy Provider: Clinic   I discussed the limitations, risks, security and privacy concerns of performing an evaluation and management service by telephone and the availability of in person appointments. I also discussed with the patient that there may be a patient responsible charge related to this service. The patient expressed understanding and agreed to proceed.  Follow Up Instructions:  I discussed the assessment and treatment plan with the patient. The patient was provided an opportunity to ask questions and all were answered. The patient agreed with the plan and demonstrated an understanding of the instructions.   The patient was advised to call back or seek an in-person evaluation if the symptoms worsen or if the condition fails to improve as anticipated.  I provided 22 minutes of non-face-to-face time during this encounter.   Meta Hatchet, PA   02/15/2021 1:54 PM [name]  MRN:  [id number]  Chief Complaint: Follow up and medication management  HPI:   Holly Mccoy is a 20 year old female with a past psychiatric history significant for major depressive disorder and generalized anxiety disorder who presents to [facility] for follow-up and medication management.  Patient is currently being managed on the following medications:  Fluoxetine 20 mg daily Hydroxyzine 25 mg 3 times daily as needed  Patient reports that she has not been using hydroxyzine lately due to experiencing no episodes of panic attacks.  Patient rates her anxiety a 6 out of 10 and that it has been somewhat tolerable.  Since taking the Prozac, patient states that her depression has been much more manageable.  Patient is  interested in increasing her dosage of Prozac for the better management of her depressive symptoms.  A PHQ 9 screen was performed with the patient scoring a 13.  A GAD 7 screen was also performed with the patient scoring a 10.  Patient is pleasant, calm, cooperative, and fully engaged in conversation during the encounter.  Patient reports that she is in a pretty good mood today and that she is currently shopping and having food with her mother.  Patient denies suicidal or homicidal ideations.  She further denies auditory or visual hallucinations.  Patient endorses fair sleep and receives on average 6 to 7 hours of sleep each night.  On occasion, patient reports that her sleep is restful.  Patient endorses appetite and eats on average 2 meals per day.  Patient endorses alcohol consumption sparingly.  Patient reports tobacco use in combination with marijuana use.  Patient reports that she will often smoke marijuana.  Visit Diagnosis:    ICD-10-CM   1. Severe episode of recurrent major depressive disorder, without psychotic features (HCC)  F33.2 FLUoxetine (PROZAC) 40 MG capsule  2. Generalized anxiety disorder  F41.1 FLUoxetine (PROZAC) 40 MG capsule    Past Psychiatric History:  Major Depressive Disorder Generalized anxiety disorder  Past Medical History:  Past Medical History:  Diagnosis Date  . Anemia   . Asthma    No past surgical history on file.  Family Psychiatric History: Grandmother (father's side) - Bipolar and schizophrenia  Family History:  Family History  Problem Relation Age of Onset  . Hypertension Mother   . Cervical cancer Maternal Grandmother   . Colon cancer Maternal Grandfather   .  Heart attack Maternal Grandfather     Social History:  Social History   Socioeconomic History  . Marital status: Single    Spouse name: Not on file  . Number of children: Not on file  . Years of education: Not on file  . Highest education level: Not on file  Occupational History   . Not on file  Tobacco Use  . Smoking status: Never Smoker  . Smokeless tobacco: Never Used  Vaping Use  . Vaping Use: Never used  Substance and Sexual Activity  . Alcohol use: Not Currently  . Drug use: Yes    Types: Marijuana  . Sexual activity: Yes    Partners: Male    Birth control/protection: Condom  Other Topics Concern  . Not on file  Social History Narrative  . Not on file   Social Determinants of Health   Financial Resource Strain: Not on file  Food Insecurity: Not on file  Transportation Needs: Not on file  Physical Activity: Not on file  Stress: Not on file  Social Connections: Not on file    Allergies:  Allergies  Allergen Reactions  . Citric Acid Itching    Metabolic Disorder Labs: No results found for: HGBA1C, MPG No results found for: PROLACTIN No results found for: CHOL, TRIG, HDL, CHOLHDL, VLDL, LDLCALC No results found for: TSH  Therapeutic Level Labs: No results found for: LITHIUM No results found for: VALPROATE No components found for:  CBMZ  Current Medications: Current Outpatient Medications  Medication Sig Dispense Refill  . albuterol (VENTOLIN HFA) 108 (90 Base) MCG/ACT inhaler Inhale into the lungs.    Marland Kitchen etonogestrel (NEXPLANON) 68 MG IMPL implant 1 each by Subdermal route once.    Marland Kitchen FLUoxetine (PROZAC) 40 MG capsule Take 1 capsule (40 mg total) by mouth daily. 30 capsule 1  . hydrOXYzine (ATARAX/VISTARIL) 25 MG tablet Take 1 tablet (25 mg total) by mouth 3 (three) times daily as needed. 90 tablet 0   No current facility-administered medications for this visit.     Musculoskeletal: Strength & Muscle Tone: Unable to assess due to telemedicine visit Gait & Station: Unable to assess due to telemedicine visit Patient leans: Unable to assess due to telemedicine visit  Psychiatric Specialty Exam: Review of Systems  Psychiatric/Behavioral: Negative for decreased concentration, dysphoric mood, hallucinations, self-injury, sleep  disturbance and suicidal ideas. The patient is nervous/anxious. The patient is not hyperactive.     There were no vitals taken for this visit.There is no height or weight on file to calculate BMI.  General Appearance: Unable to assess due to telemedicine visit  Eye Contact:  Unable to assess due to telemedicine visit  Speech:  Clear and Coherent and Normal Rate  Volume:  Normal  Mood:  Anxious and Euthymic  Affect:  Appropriate and Congruent  Thought Process:  Coherent, Goal Directed and Descriptions of Associations: Intact  Orientation:  Full (Time, Place, and Person)  Thought Content: WDL and Logical   Suicidal Thoughts:  No  Homicidal Thoughts:  No  Memory:  Immediate;   Good Recent;   Good Remote;   Good  Judgement:  Good  Insight:  Fair  Psychomotor Activity:  Normal  Concentration:  Concentration: Good and Attention Span: Good  Recall:  Good  Fund of Knowledge: Good  Language: Good  Akathisia:  NA  Handed:  Right  AIMS (if indicated): not done  Assets:  Communication Skills Desire for Improvement Housing Vocational/Educational  ADL's:  Intact  Cognition: WNL  Sleep:  Fair   Screenings: GAD-7   Flowsheet Row Video Visit from 02/15/2021 in [facility] Video Visit from 12/04/2020 in [facility]  Total GAD-7 Score 10 11    PHQ2-9   Flowsheet Row Video Visit from 02/15/2021 in [facility]  PHQ-2 Total Score 4  PHQ-9 Total Score 13    Flowsheet Row Video Visit from 02/15/2021 in [facility]  C-SSRS RISK CATEGORY No Risk       Assessment and Plan:  Galia Rahm is a 20 year old female with a past psychiatric history significant for major depressive disorder and generalized anxiety disorder who presents to [facility] for follow-up and medication management.  Patient reports that she has not been taking her  hydroxyzine as often and reports not experiencing panic symptoms.  Patient reports that her depression has been more manageable but is interested in adjusting her dose for the better management of her depression.  Patient was recommended increasing her dosage of Prozac from 20 mg to 40 mg for the improvement in her depressive symptoms.  Patient was agreeable to recommendation.  Patient's medication will be e-prescribed to pharmacy of choice.  1. Severe episode of recurrent major depressive disorder, without psychotic features (HCC)  - FLUoxetine (PROZAC) 40 MG capsule; Take 1 capsule (40 mg total) by mouth daily.  Dispense: 30 capsule; Refill: 1  2. Generalized anxiety disorder Patient to continue taking hydroxyzine as needed  - FLUoxetine (PROZAC) 40 MG capsule; Take 1 capsule (40 mg total) by mouth daily.  Dispense: 30 capsule; Refill: 1  Patient to follow-up in 6 weeks  Meta Hatchet, PA 02/15/2021, 1:54 PM

## 2021-02-25 ENCOUNTER — Other Ambulatory Visit: Payer: Self-pay

## 2021-02-25 ENCOUNTER — Ambulatory Visit (INDEPENDENT_AMBULATORY_CARE_PROVIDER_SITE_OTHER): Payer: No Payment, Other | Admitting: Behavioral Health

## 2021-02-25 DIAGNOSIS — F332 Major depressive disorder, recurrent severe without psychotic features: Secondary | ICD-10-CM | POA: Diagnosis not present

## 2021-02-25 NOTE — Progress Notes (Signed)
  THERAPIST PROGRESS NOTE  Virtual Visit via Video Note  I connected with Holly Mccoy on 02/25/21 at  3:00 PM EST by a video enabled telemedicine application and verified that I am speaking with the correct person using two identifiers.  Location: Patient: Work Provider: Clinical Home - [facility]   I discussed the limitations of evaluation and management by telemedicine and the availability of in person appointments. The patient expressed understanding and agreed to proceed.  I discussed the assessment and treatment plan with the patient. The patient was provided an opportunity to ask questions and all were answered. The patient agreed with the plan and demonstrated an understanding of the instructions.   The patient was advised to call back or seek an in-person evaluation if the symptoms worsen or if the condition fails to improve as anticipated.  I provided 50 minutes of non-face-to-face time during this encounter.   Mamie Nick, Counselor   Session Time: 3:00PM  Participation Level: Active  Behavioral Response: Neat and Well GroomedAlertPleasant  Type of Therapy: Individual Therapy  Treatment Goals addressed: Coping  Interventions: CBT, Solution Focused and Supportive  Summary: Holly Mccoy is a 20 y.o. female who presents today for a virtual outpatient therapy session with this Clinical research associate. Clt shared that she has been struggling with certain mind constructs and beliefs. She also reports having difficulty identifying her sexual orientation. Clt noted that she is no longer enrolled in school because "I can't seen to find something I'm interested in." She reports feeling "disappointed" in herself because she quit her goal of going to school. 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 Clt talked about having the fear of disappointing her family members if she goes against their religous beliefs. Clt states that because  she has a "strict [place] family" she feels she will disappoint them if she doesn't have the same beliefs as they do. Clt stated "I feel like I disappointed my mom, my cousin, and my pawpaw." Clt shared with this Clinical research associate that she has a close friend with whom she enjoys spending her time with; however, she doesn't want to send "mixed signals". Clt denied SI/HI/AVH/self-harm.  Suicidal/Homicidal: No  Therapist Response: Therapist offered encouragement and support. Therapist encouraged self-care and prioritizing her needs over seeking approval and validation from others. Therapist used Solution-Focus practice to assist client in moving towards the future that she wants and to learn what can be done differently by using her existing skills, strategies, and ideas.  Plan: Return again in 2 weeks.  Diagnosis: Axis I: Major Depression, Recurrent severe    Axis II: No diagnosis    Mamie Nick, Counselor 02/25/2021

## 2021-03-14 ENCOUNTER — Ambulatory Visit (HOSPITAL_COMMUNITY): Payer: No Payment, Other | Admitting: Behavioral Health

## 2021-03-14 ENCOUNTER — Other Ambulatory Visit: Payer: Self-pay

## 2021-04-02 ENCOUNTER — Telehealth (HOSPITAL_COMMUNITY): Payer: No Payment, Other | Admitting: Physician Assistant

## 2021-04-09 ENCOUNTER — Ambulatory Visit (HOSPITAL_COMMUNITY): Payer: No Payment, Other | Admitting: Behavioral Health

## 2021-04-25 IMAGING — CR DG HAND COMPLETE 3+V*R*
3 series · 3 of 3 positions shown · non-contrast
Comparison: None.

CLINICAL DATA: Laceration

EXAM:
RIGHT HAND - COMPLETE 3+ VIEW

[x hand pa right]
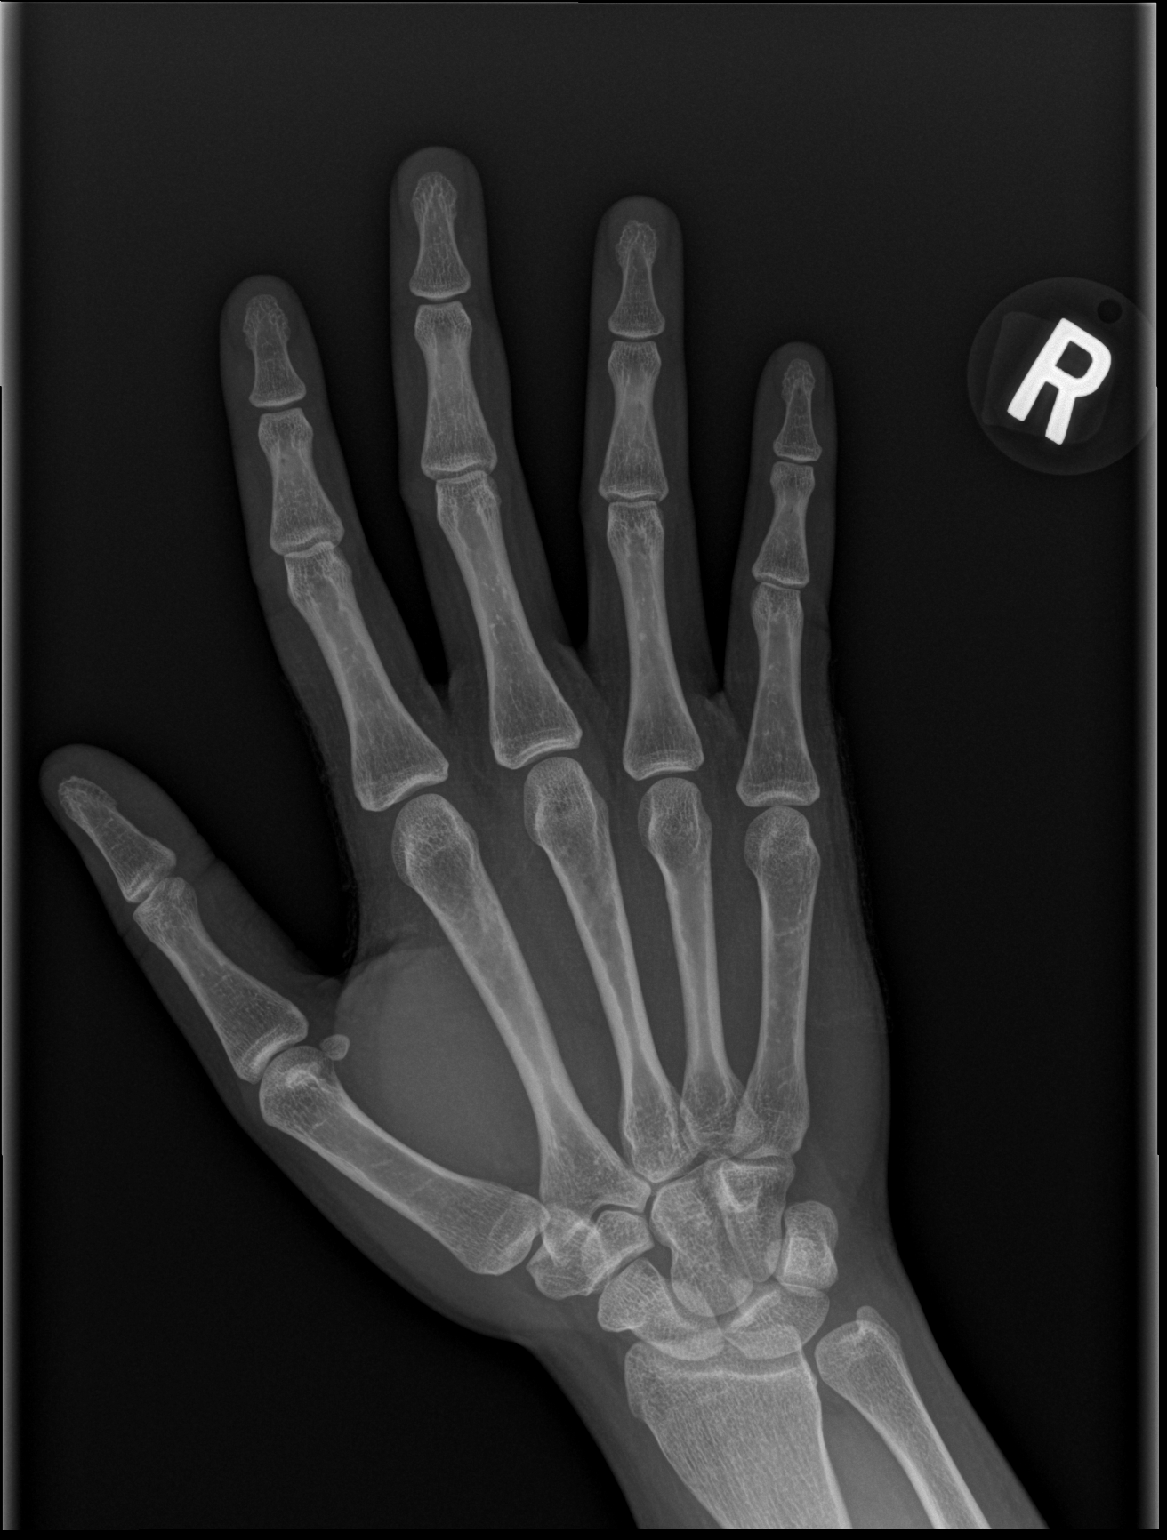

[x hand obl right]
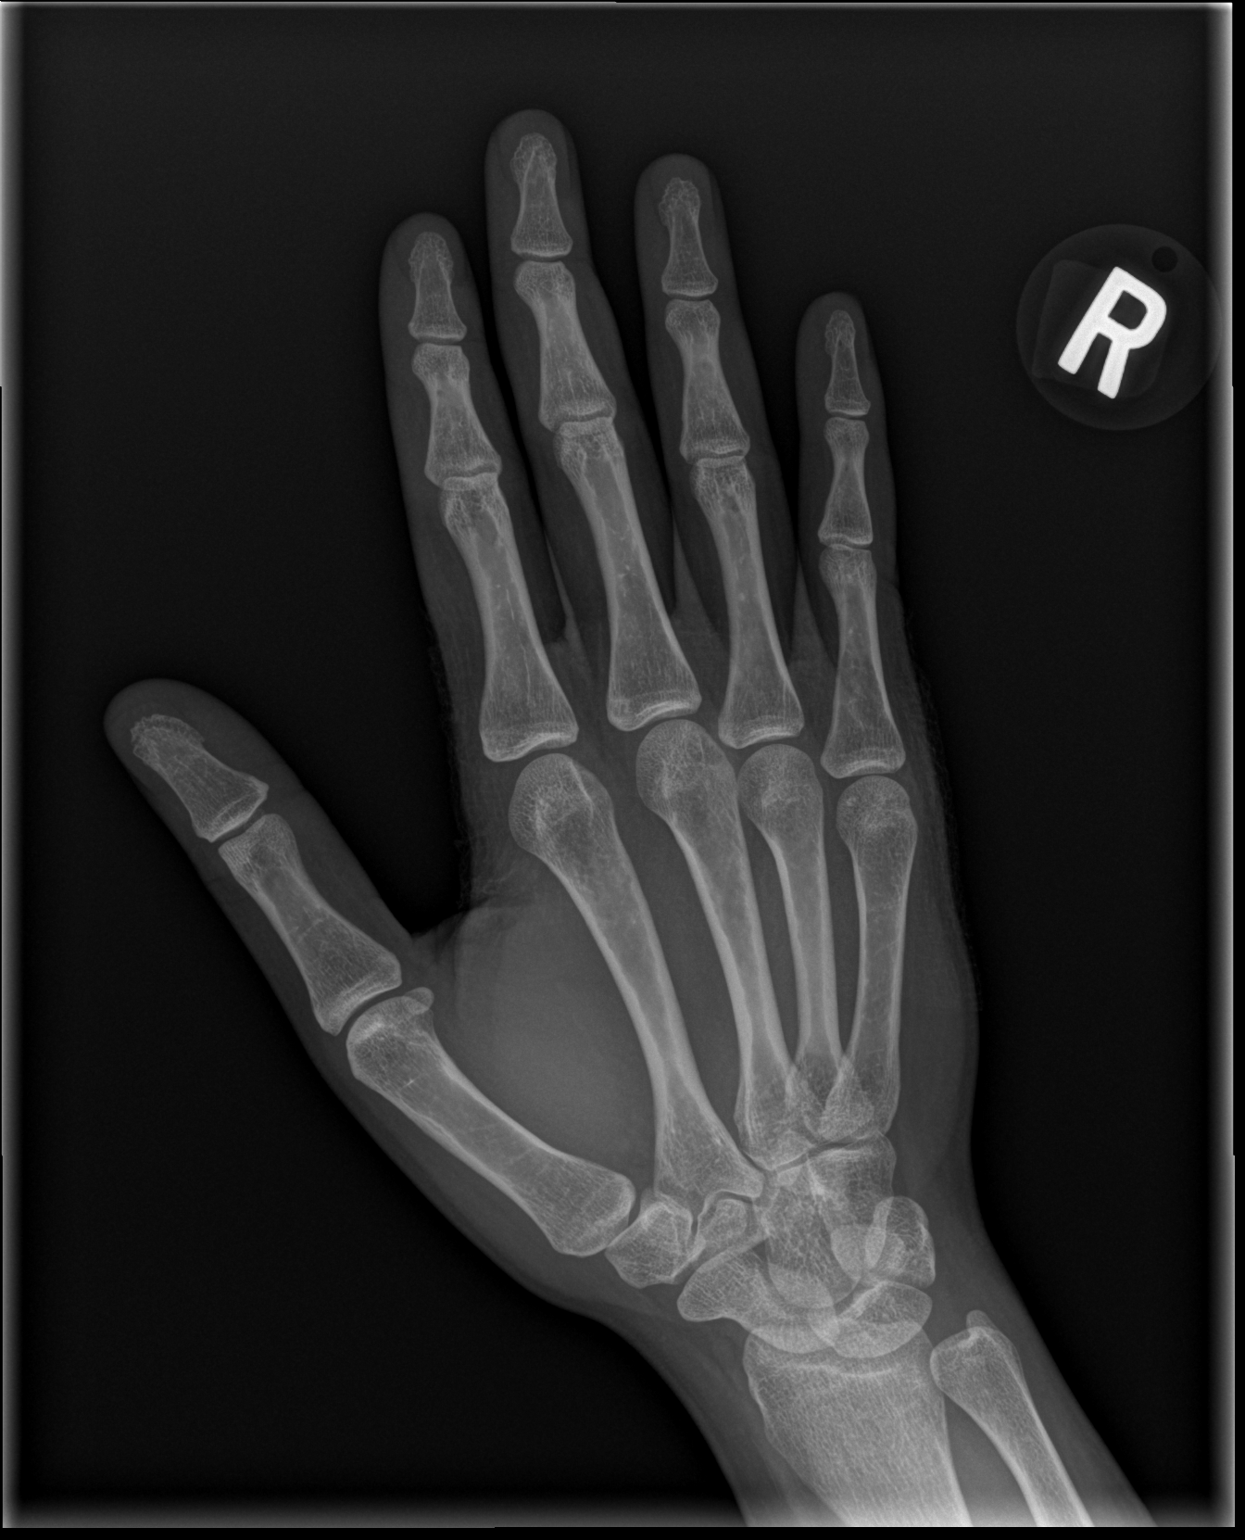

[x hand lat right]
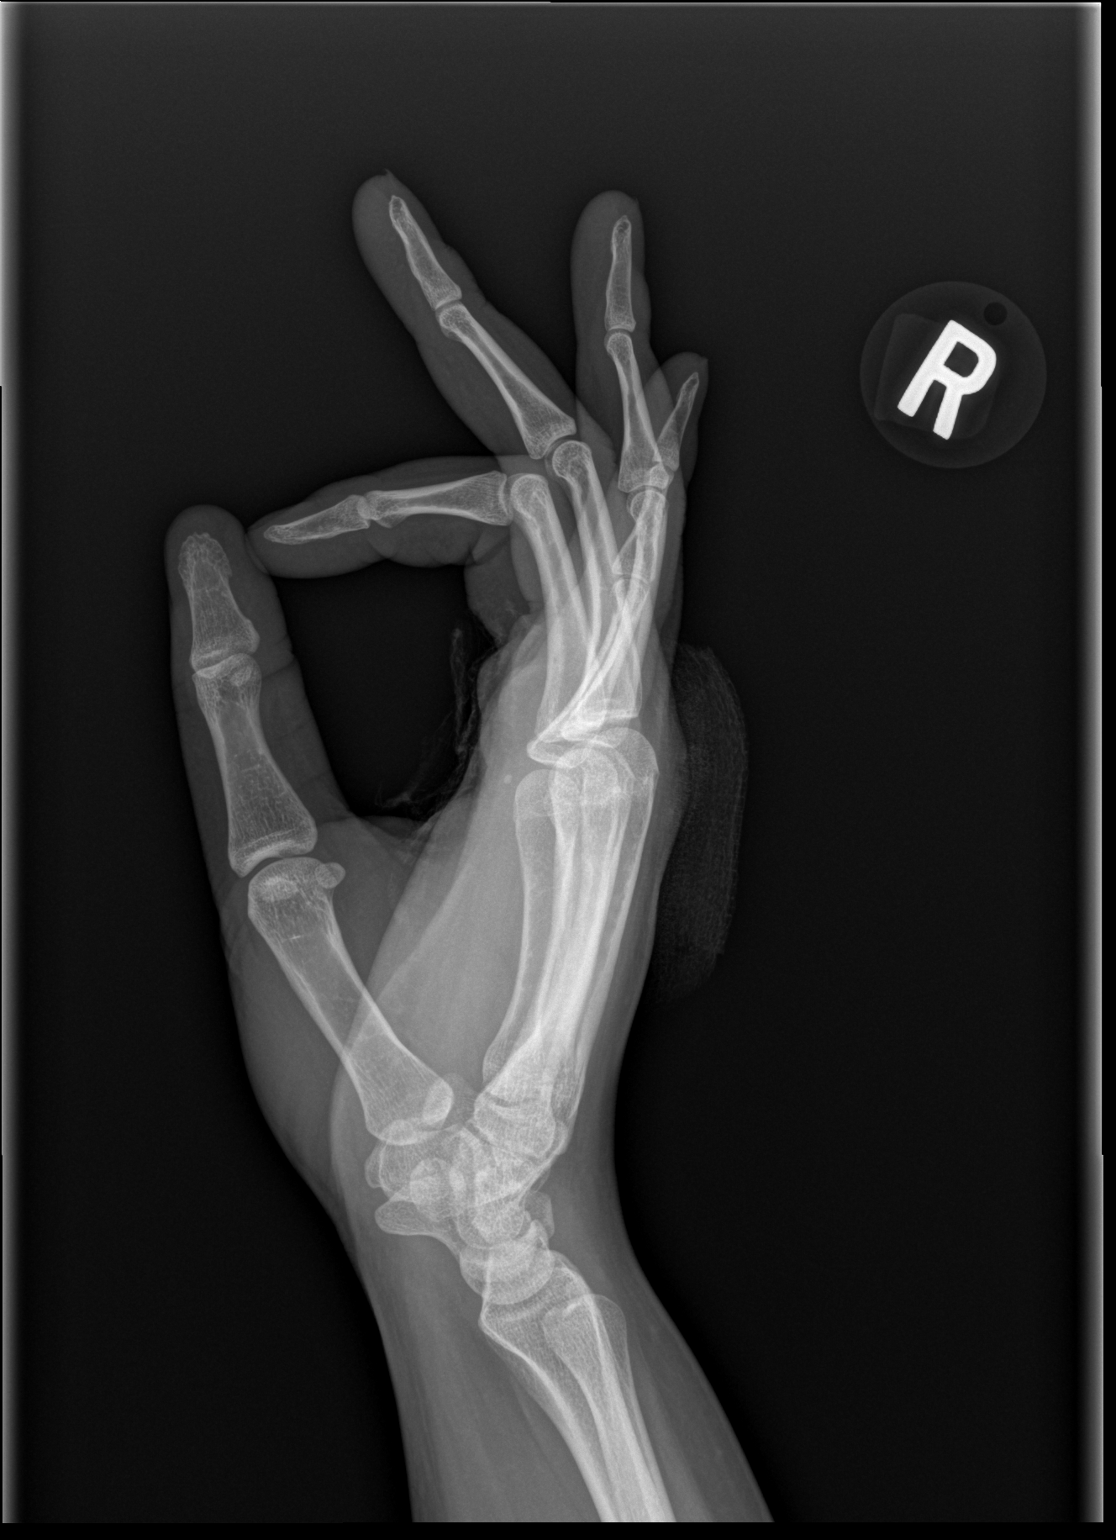

[3 of 3 positions shown; findings below may reference images not displayed]

FINDINGS: There is no evidence of fracture or dislocation. There is no
evidence of arthropathy or other focal bone abnormality. Mild soft
tissue swelling seen on the dorsum of the hand with a small
overlying laceration. No radiopaque foreign body.
IMPRESSION: No acute osseous abnormality or radiopaque foreign body.

## 2021-05-24 ENCOUNTER — Telehealth (HOSPITAL_COMMUNITY): Payer: No Payment, Other | Admitting: Physician Assistant

## 2021-05-24 ENCOUNTER — Other Ambulatory Visit: Payer: Self-pay

## 2021-06-14 ENCOUNTER — Telehealth (INDEPENDENT_AMBULATORY_CARE_PROVIDER_SITE_OTHER): Payer: No Payment, Other | Admitting: Physician Assistant

## 2021-06-14 DIAGNOSIS — F331 Major depressive disorder, recurrent, moderate: Secondary | ICD-10-CM

## 2021-06-14 DIAGNOSIS — F411 Generalized anxiety disorder: Secondary | ICD-10-CM | POA: Diagnosis not present

## 2021-06-14 MED ORDER — HYDROXYZINE HCL 10 MG PO TABS: 10.0000 mg | ORAL_TABLET | Freq: Three times a day (TID) | ORAL | 1 refills | Status: AC | PRN

## 2021-06-14 MED ORDER — FLUOXETINE HCL 20 MG PO CAPS: 20.0000 mg | ORAL_CAPSULE | Freq: Every day | ORAL | 2 refills | Status: AC

## 2021-06-14 NOTE — Progress Notes (Signed)
BH MD/PA/NP OP Progress Note  Virtual Visit via Telephone Note  I connected with Holly Mccoy on 06/14/21 at  4:00 PM EDT by telephone and verified that I am speaking with the correct person using two identifiers.  Location: Patient: Home Provider: Clinic   I discussed the limitations, risks, security and privacy concerns of performing an evaluation and management service by telephone and the availability of in person appointments. I also discussed with the patient that there may be a patient responsible charge related to this service. The patient expressed understanding and agreed to proceed.  Follow Up Instructions:  I discussed the assessment and treatment plan with the patient. The patient was provided an opportunity to ask questions and all were answered. The patient agreed with the plan and demonstrated an understanding of the instructions.   The patient was advised to call back or seek an in-person evaluation if the symptoms worsen or if the condition fails to improve as anticipated.  I provided 20 minutes of non-face-to-face time during this encounter.  Meta Hatchet, PA    06/14/2021 4:21 PM Holly Mccoy  MRN:  [id number]  Chief Complaint: Follow up and medication management  HPI:   Holly Mccoy is a 20 year old female with a past psychiatric history significant for major depressive disorder and generalized anxiety disorder who presents to [facility] via virtual telephone visit for follow-up and medication management.  Patient is currently being managed on the following medications:  Fluoxetine 40 mg daily Hydroxyzine 25 mg 3 times daily as needed  Patient reports that she has not been taking her Prozac and states that she has not been taking the medication for quite some time.  Patient is unable to recall the last time she took her Prozac but states that she stopped taking it due to experiencing improvements in her  mood.  Patient rates her current depression a 5 or 6 out of 10.  Patient endorses the following depressive symptoms: depressed mood, lack of motivation, lack of energy, feelings of worthlessness, and fluctuating weight.  Patient denies sleep disturbances and difficulty concentrating.  Patient further endorses anxiety she rates a 5 or 6 out of 10.  Triggers to her anxiety include being around people and not feeling her best.  Patient's stressors include being without a car and recently losing her job.  A PHQ-9 screen was performed with the patient scoring a 14.  A GAD-7 screen was also performed with the patient scoring a 12.  Patient is calm, cooperative, and fully engaged in conversation during the encounter.  Patient states that she has a lot on her mind at the moment.  The patient denies suicidal or homicidal ideations.  She further denies auditory or visual hallucinations and does not appear to be responding to internal/external stimuli.  Patient endorses good sleep and receives on average 6 to 8 hours of sleep each night.  Patient endorses fair appetite and eats on average 1-2 meals per day.  Patient denies tobacco use and alcohol consumption.  Patient endorses illicit drug use in the form of marijuana but states that she does not smoke often.  Visit Diagnosis:    ICD-10-CM   1. Severe episode of recurrent major depressive disorder, without psychotic features (HCC)  F33.2 FLUoxetine (PROZAC) 20 MG capsule    2. Generalized anxiety disorder  F41.1 FLUoxetine (PROZAC) 20 MG capsule    hydrOXYzine (ATARAX/VISTARIL) 10 MG tablet      Past Psychiatric History:  Major depressive disorder  Generalized anxiety disorder  Past Medical History:  Past Medical History:  Diagnosis Date   Anemia    Asthma    No past surgical history on file.  Family Psychiatric History:  Grandmother (father's side) - Bipolar and schizophrenia  Family History:  Family History  Problem Relation Age of Onset    Hypertension Mother    Cervical cancer Maternal Grandmother    Colon cancer Maternal Grandfather    Heart attack Maternal Grandfather     Social History:  Social History   Socioeconomic History   Marital status: Single    Spouse name: Not on file   Number of children: Not on file   Years of education: Not on file   Highest education level: Not on file  Occupational History   Not on file  Tobacco Use   Smoking status: Never   Smokeless tobacco: Never  Vaping Use   Vaping Use: Never used  Substance and Sexual Activity   Alcohol use: Not Currently   Drug use: Yes    Types: Marijuana   Sexual activity: Yes    Partners: Male    Birth control/protection: Condom  Other Topics Concern   Not on file  Social History Narrative   Not on file   Social Determinants of Health   Financial Resource Strain: Not on file  Food Insecurity: Not on file  Transportation Needs: Not on file  Physical Activity: Not on file  Stress: Not on file  Social Connections: Not on file    Allergies:  Allergies  Allergen Reactions   Citric Acid Itching    Metabolic Disorder Labs: No results found for: HGBA1C, MPG No results found for: PROLACTIN No results found for: CHOL, TRIG, HDL, CHOLHDL, VLDL, LDLCALC No results found for: TSH  Therapeutic Level Labs: No results found for: LITHIUM No results found for: VALPROATE No components found for:  CBMZ  Current Medications: Current Outpatient Medications  Medication Sig Dispense Refill   FLUoxetine (PROZAC) 20 MG capsule Take 1 capsule (20 mg total) by mouth daily. 30 capsule 2   albuterol (VENTOLIN HFA) 108 (90 Base) MCG/ACT inhaler Inhale into the lungs.     etonogestrel (NEXPLANON) 68 MG IMPL implant 1 each by Subdermal route once.     hydrOXYzine (ATARAX/VISTARIL) 10 MG tablet Take 1 tablet (10 mg total) by mouth 3 (three) times daily as needed. 75 tablet 1   No current facility-administered medications for this visit.      Musculoskeletal: Strength & Muscle Tone: Unable to assess due to telemedicine visit Gait & Station: Unable to assess due to telemedicine visit Patient leans: Unable to assess due to telemedicine visit  Psychiatric Specialty Exam: Review of Systems  Psychiatric/Behavioral:  Negative for decreased concentration, dysphoric mood, hallucinations, self-injury, sleep disturbance and suicidal ideas. The patient is nervous/anxious. The patient is not hyperactive.    There were no vitals taken for this visit.There is no height or weight on file to calculate BMI.  General Appearance: Unable to assess due to telemedicine visit  Eye Contact:  Unable to assess due to telemedicine visit  Speech:  Clear and Coherent and Normal Rate  Volume:  Normal  Mood:  Anxious and Depressed  Affect:  Congruent and Depressed  Thought Process:  Coherent and Descriptions of Associations: Intact  Orientation:  Full (Time, Place, and Person)  Thought Content: WDL   Suicidal Thoughts:  No  Homicidal Thoughts:  No  Memory:  Immediate;   Good Recent;   Good Remote;  Good  Judgement:  Good  Insight:  Fair  Psychomotor Activity:  Normal  Concentration:  Concentration: Good and Attention Span: Good  Recall:  Good  Fund of Knowledge: Good  Language: Good  Akathisia:  NA  Handed:  Right  AIMS (if indicated): not done  Assets:  Communication Skills Desire for Improvement Housing Vocational/Educational  ADL's:  Intact  Cognition: WNL  Sleep:  Good   Screenings: GAD-7    Flowsheet Row Video Visit from 06/14/2021 in [facility] Video Visit from 02/15/2021 in [facility] Video Visit from 12/04/2020 in [facility]  Total GAD-7 Score 12 10 11       PHQ2-9    Flowsheet Row Video Visit from 06/14/2021 in [facility] Video Visit from 02/15/2021 in [facility]  PHQ-2  Total Score 4 4  PHQ-9 Total Score 14 13      Flowsheet Row Video Visit from 06/14/2021 in [facility] Video Visit from 02/15/2021 in [facility]  C-SSRS RISK CATEGORY Low Risk No Risk        Assessment and Plan:   Holly Mccoy is a 20 year old female with a past psychiatric history significant for major depressive disorder and generalized anxiety disorder who presents to [facility] via virtual telephone visit for follow-up and medication management.  Patient reports that she has not been taking her Prozac medication for quite some time.  Patient endorses anxiety and the following depressive symptoms: depressed mood, lack of motivation, lack of energy, feelings of worthlessness, and fluctuating weight.  Patient was encouraged to being placed back on her Prozac and hydroxyzine.  Patient was agreeable to recommendation.  Patient to be placed back on Prozac 20 mg daily as well as hydroxyzine 25 mg 3 times daily as needed.  Patient's medications to be e-prescribed to pharmacy of choice.  1. Generalized anxiety disorder  - FLUoxetine (PROZAC) 20 MG capsule; Take 1 capsule (20 mg total) by mouth daily.  Dispense: 30 capsule; Refill: 2 - hydrOXYzine (ATARAX/VISTARIL) 10 MG tablet; Take 1 tablet (10 mg total) by mouth 3 (three) times daily as needed.  Dispense: 75 tablet; Refill: 1  2. Moderate episode of recurrent major depressive disorder (HCC)  - FLUoxetine (PROZAC) 20 MG capsule; Take 1 capsule (20 mg total) by mouth daily.  Dispense: 30 capsule; Refill: 2  Patient to follow up in 6 weeks Provider spent a total of 20 minutes with the patient/reviewing patient's chart  [facility], PA 06/14/2021, 4:21 PM

## 2021-06-17 ENCOUNTER — Encounter (HOSPITAL_COMMUNITY): Payer: Self-pay | Admitting: Physician Assistant

## 2021-06-17 DIAGNOSIS — F331 Major depressive disorder, recurrent, moderate: Secondary | ICD-10-CM | POA: Insufficient documentation

## 2021-07-26 ENCOUNTER — Telehealth (HOSPITAL_COMMUNITY): Payer: No Payment, Other | Admitting: Physician Assistant

## 2021-07-26 ENCOUNTER — Other Ambulatory Visit: Payer: Self-pay
